# Patient Record
Sex: Female | Born: 1970 | Hispanic: Yes | Marital: Married | Smoking: Never smoker
Health system: Southern US, Community
[De-identification: ages and names within clinical notes are randomized; demographics above are authoritative.]

## PROBLEM LIST (undated history)

## (undated) DIAGNOSIS — Z789 Other specified health status: Secondary | ICD-10-CM

## (undated) HISTORY — PX: NO PAST SURGERIES: SHX2092

---

## 2004-05-01 ENCOUNTER — Ambulatory Visit (HOSPITAL_COMMUNITY): Admission: RE | Admit: 2004-05-01 | Discharge: 2004-05-01 | Payer: Self-pay | Admitting: *Deleted

## 2004-12-11 ENCOUNTER — Ambulatory Visit (HOSPITAL_COMMUNITY): Admission: RE | Admit: 2004-12-11 | Discharge: 2004-12-11 | Payer: Self-pay | Admitting: Obstetrics and Gynecology

## 2008-02-03 ENCOUNTER — Ambulatory Visit (HOSPITAL_COMMUNITY): Admission: RE | Admit: 2008-02-03 | Discharge: 2008-02-03 | Payer: Self-pay | Admitting: General Surgery

## 2008-04-11 ENCOUNTER — Ambulatory Visit (HOSPITAL_COMMUNITY): Admission: RE | Admit: 2008-04-11 | Discharge: 2008-04-11 | Payer: Self-pay | Admitting: General Surgery

## 2010-06-30 ENCOUNTER — Encounter: Payer: Self-pay | Admitting: General Surgery

## 2011-05-29 ENCOUNTER — Other Ambulatory Visit (HOSPITAL_COMMUNITY): Payer: Self-pay | Admitting: *Deleted

## 2011-06-25 ENCOUNTER — Other Ambulatory Visit (HOSPITAL_COMMUNITY): Payer: Self-pay | Admitting: *Deleted

## 2011-06-25 ENCOUNTER — Ambulatory Visit (HOSPITAL_COMMUNITY)
Admission: RE | Admit: 2011-06-25 | Discharge: 2011-06-25 | Disposition: A | Discharge status: Home / Self Care | Payer: PRIVATE HEALTH INSURANCE | Source: Ambulatory Visit | Attending: *Deleted | Admitting: *Deleted

## 2011-06-25 DIAGNOSIS — N6009 Solitary cyst of unspecified breast: Secondary | ICD-10-CM | POA: Insufficient documentation

## 2011-06-25 DIAGNOSIS — N63 Unspecified lump in unspecified breast: Secondary | ICD-10-CM | POA: Insufficient documentation

## 2012-07-26 ENCOUNTER — Other Ambulatory Visit (HOSPITAL_COMMUNITY): Payer: Self-pay | Admitting: Nurse Practitioner

## 2012-07-26 DIAGNOSIS — Z139 Encounter for screening, unspecified: Secondary | ICD-10-CM

## 2012-08-03 ENCOUNTER — Ambulatory Visit (HOSPITAL_COMMUNITY)
Admission: RE | Admit: 2012-08-03 | Discharge: 2012-08-03 | Disposition: A | Discharge status: Home / Self Care | Payer: Self-pay | Source: Ambulatory Visit | Attending: Nurse Practitioner | Admitting: Nurse Practitioner

## 2012-08-03 DIAGNOSIS — Z139 Encounter for screening, unspecified: Secondary | ICD-10-CM

## 2013-08-31 ENCOUNTER — Other Ambulatory Visit (HOSPITAL_COMMUNITY): Payer: Self-pay | Admitting: Nurse Practitioner

## 2013-08-31 DIAGNOSIS — Z1231 Encounter for screening mammogram for malignant neoplasm of breast: Secondary | ICD-10-CM

## 2013-09-05 ENCOUNTER — Ambulatory Visit (HOSPITAL_COMMUNITY)
Admission: RE | Admit: 2013-09-05 | Discharge: 2013-09-05 | Disposition: A | Discharge status: Home / Self Care | Payer: Self-pay | Source: Ambulatory Visit | Attending: Nurse Practitioner | Admitting: Nurse Practitioner

## 2013-09-05 DIAGNOSIS — Z1231 Encounter for screening mammogram for malignant neoplasm of breast: Secondary | ICD-10-CM

## 2013-09-07 ENCOUNTER — Other Ambulatory Visit: Payer: Self-pay | Admitting: Nurse Practitioner

## 2013-09-07 DIAGNOSIS — R928 Other abnormal and inconclusive findings on diagnostic imaging of breast: Secondary | ICD-10-CM

## 2013-09-28 ENCOUNTER — Ambulatory Visit (HOSPITAL_COMMUNITY)
Admission: RE | Admit: 2013-09-28 | Discharge: 2013-09-28 | Disposition: A | Discharge status: Home / Self Care | Payer: PRIVATE HEALTH INSURANCE | Source: Ambulatory Visit | Attending: Nurse Practitioner | Admitting: Nurse Practitioner

## 2013-09-28 DIAGNOSIS — R928 Other abnormal and inconclusive findings on diagnostic imaging of breast: Secondary | ICD-10-CM | POA: Insufficient documentation

## 2014-11-09 ENCOUNTER — Other Ambulatory Visit (HOSPITAL_COMMUNITY): Payer: Self-pay | Admitting: *Deleted

## 2014-11-09 DIAGNOSIS — Z1231 Encounter for screening mammogram for malignant neoplasm of breast: Secondary | ICD-10-CM

## 2014-11-27 ENCOUNTER — Ambulatory Visit (HOSPITAL_COMMUNITY)
Admission: RE | Admit: 2014-11-27 | Discharge: 2014-11-27 | Disposition: A | Discharge status: Home / Self Care | Payer: PRIVATE HEALTH INSURANCE | Source: Ambulatory Visit | Attending: *Deleted | Admitting: *Deleted

## 2014-11-27 DIAGNOSIS — Z1231 Encounter for screening mammogram for malignant neoplasm of breast: Secondary | ICD-10-CM | POA: Insufficient documentation

## 2016-01-22 ENCOUNTER — Other Ambulatory Visit (HOSPITAL_COMMUNITY): Payer: Self-pay | Admitting: *Deleted

## 2016-01-22 DIAGNOSIS — Z1231 Encounter for screening mammogram for malignant neoplasm of breast: Secondary | ICD-10-CM

## 2016-01-28 ENCOUNTER — Ambulatory Visit (HOSPITAL_COMMUNITY)
Admission: RE | Admit: 2016-01-28 | Discharge: 2016-01-28 | Disposition: A | Discharge status: Home / Self Care | Payer: Self-pay | Source: Ambulatory Visit | Attending: *Deleted | Admitting: *Deleted

## 2016-01-28 DIAGNOSIS — Z1231 Encounter for screening mammogram for malignant neoplasm of breast: Secondary | ICD-10-CM

## 2017-01-08 ENCOUNTER — Other Ambulatory Visit (HOSPITAL_COMMUNITY): Payer: Self-pay | Admitting: *Deleted

## 2017-01-08 DIAGNOSIS — Z1231 Encounter for screening mammogram for malignant neoplasm of breast: Secondary | ICD-10-CM

## 2017-02-02 ENCOUNTER — Encounter (HOSPITAL_COMMUNITY): Payer: Self-pay | Admitting: Radiology

## 2017-02-02 ENCOUNTER — Ambulatory Visit (HOSPITAL_COMMUNITY)
Admission: RE | Admit: 2017-02-02 | Discharge: 2017-02-02 | Disposition: A | Discharge status: Home / Self Care | Payer: PRIVATE HEALTH INSURANCE | Source: Ambulatory Visit | Attending: *Deleted | Admitting: *Deleted

## 2017-02-02 DIAGNOSIS — Z1231 Encounter for screening mammogram for malignant neoplasm of breast: Secondary | ICD-10-CM | POA: Diagnosis not present

## 2017-05-15 ENCOUNTER — Other Ambulatory Visit (HOSPITAL_COMMUNITY): Payer: Self-pay | Admitting: *Deleted

## 2017-05-15 DIAGNOSIS — N949 Unspecified condition associated with female genital organs and menstrual cycle: Secondary | ICD-10-CM

## 2017-05-15 DIAGNOSIS — N941 Unspecified dyspareunia: Secondary | ICD-10-CM

## 2017-05-20 ENCOUNTER — Ambulatory Visit (HOSPITAL_COMMUNITY)
Admission: RE | Admit: 2017-05-20 | Discharge: 2017-05-20 | Disposition: A | Discharge status: Home / Self Care | Payer: PRIVATE HEALTH INSURANCE | Source: Ambulatory Visit | Attending: *Deleted | Admitting: *Deleted

## 2017-05-25 ENCOUNTER — Ambulatory Visit (HOSPITAL_COMMUNITY)
Admission: RE | Admit: 2017-05-25 | Discharge: 2017-05-25 | Disposition: A | Discharge status: Home / Self Care | Payer: PRIVATE HEALTH INSURANCE | Source: Ambulatory Visit | Attending: *Deleted | Admitting: *Deleted

## 2017-05-25 DIAGNOSIS — N941 Unspecified dyspareunia: Secondary | ICD-10-CM

## 2017-05-25 DIAGNOSIS — N949 Unspecified condition associated with female genital organs and menstrual cycle: Secondary | ICD-10-CM

## 2018-01-12 ENCOUNTER — Other Ambulatory Visit (HOSPITAL_COMMUNITY): Payer: Self-pay | Admitting: *Deleted

## 2018-01-12 DIAGNOSIS — Z1231 Encounter for screening mammogram for malignant neoplasm of breast: Secondary | ICD-10-CM

## 2018-02-15 ENCOUNTER — Ambulatory Visit (HOSPITAL_COMMUNITY)
Admission: RE | Admit: 2018-02-15 | Discharge: 2018-02-15 | Disposition: A | Discharge status: Home / Self Care | Payer: PRIVATE HEALTH INSURANCE | Source: Ambulatory Visit | Attending: *Deleted | Admitting: *Deleted

## 2018-02-15 ENCOUNTER — Encounter (HOSPITAL_COMMUNITY): Payer: Self-pay

## 2018-02-15 DIAGNOSIS — Z1231 Encounter for screening mammogram for malignant neoplasm of breast: Secondary | ICD-10-CM | POA: Insufficient documentation

## 2018-02-16 ENCOUNTER — Other Ambulatory Visit (HOSPITAL_COMMUNITY): Payer: Self-pay | Admitting: *Deleted

## 2018-02-16 ENCOUNTER — Other Ambulatory Visit (HOSPITAL_COMMUNITY): Payer: Self-pay | Admitting: Nurse Practitioner

## 2018-02-16 ENCOUNTER — Ambulatory Visit (HOSPITAL_COMMUNITY)
Admission: RE | Admit: 2018-02-16 | Discharge: 2018-02-16 | Disposition: A | Discharge status: Home / Self Care | Payer: PRIVATE HEALTH INSURANCE | Source: Ambulatory Visit | Attending: Nurse Practitioner | Admitting: Nurse Practitioner

## 2018-02-16 DIAGNOSIS — M79672 Pain in left foot: Secondary | ICD-10-CM

## 2018-02-16 DIAGNOSIS — R928 Other abnormal and inconclusive findings on diagnostic imaging of breast: Secondary | ICD-10-CM

## 2018-02-16 DIAGNOSIS — M19072 Primary osteoarthritis, left ankle and foot: Secondary | ICD-10-CM | POA: Insufficient documentation

## 2018-03-02 ENCOUNTER — Encounter (HOSPITAL_COMMUNITY): Payer: PRIVATE HEALTH INSURANCE

## 2018-03-02 ENCOUNTER — Ambulatory Visit (HOSPITAL_COMMUNITY)
Admission: RE | Admit: 2018-03-02 | Discharge: 2018-03-02 | Disposition: A | Discharge status: Home / Self Care | Payer: PRIVATE HEALTH INSURANCE | Source: Ambulatory Visit | Attending: *Deleted | Admitting: *Deleted

## 2018-03-02 ENCOUNTER — Encounter (HOSPITAL_COMMUNITY): Payer: Self-pay

## 2018-03-02 DIAGNOSIS — R928 Other abnormal and inconclusive findings on diagnostic imaging of breast: Secondary | ICD-10-CM | POA: Insufficient documentation

## 2019-02-01 ENCOUNTER — Other Ambulatory Visit (HOSPITAL_COMMUNITY): Payer: Self-pay | Admitting: *Deleted

## 2019-02-01 DIAGNOSIS — Z1231 Encounter for screening mammogram for malignant neoplasm of breast: Secondary | ICD-10-CM

## 2019-03-03 ENCOUNTER — Other Ambulatory Visit: Payer: Self-pay

## 2019-03-03 ENCOUNTER — Ambulatory Visit (HOSPITAL_COMMUNITY)
Admission: RE | Admit: 2019-03-03 | Discharge: 2019-03-03 | Disposition: A | Discharge status: Home / Self Care | Payer: PRIVATE HEALTH INSURANCE | Source: Ambulatory Visit | Attending: *Deleted | Admitting: *Deleted

## 2019-03-03 DIAGNOSIS — Z1231 Encounter for screening mammogram for malignant neoplasm of breast: Secondary | ICD-10-CM | POA: Diagnosis not present

## 2019-03-07 ENCOUNTER — Other Ambulatory Visit (HOSPITAL_COMMUNITY): Payer: Self-pay | Admitting: *Deleted

## 2019-03-07 DIAGNOSIS — R921 Mammographic calcification found on diagnostic imaging of breast: Secondary | ICD-10-CM

## 2019-03-29 ENCOUNTER — Ambulatory Visit (HOSPITAL_COMMUNITY): Admission: RE | Admit: 2019-03-29 | Payer: PRIVATE HEALTH INSURANCE | Source: Ambulatory Visit

## 2019-03-29 ENCOUNTER — Ambulatory Visit (HOSPITAL_COMMUNITY)
Admission: RE | Admit: 2019-03-29 | Discharge: 2019-03-29 | Disposition: A | Discharge status: Home / Self Care | Payer: PRIVATE HEALTH INSURANCE | Source: Ambulatory Visit | Attending: *Deleted | Admitting: *Deleted

## 2019-03-29 ENCOUNTER — Other Ambulatory Visit: Payer: Self-pay

## 2019-03-29 DIAGNOSIS — R921 Mammographic calcification found on diagnostic imaging of breast: Secondary | ICD-10-CM

## 2019-04-05 ENCOUNTER — Other Ambulatory Visit (HOSPITAL_COMMUNITY): Payer: Self-pay | Admitting: *Deleted

## 2019-09-21 ENCOUNTER — Other Ambulatory Visit (HOSPITAL_COMMUNITY): Payer: Self-pay | Admitting: *Deleted

## 2019-09-21 DIAGNOSIS — R921 Mammographic calcification found on diagnostic imaging of breast: Secondary | ICD-10-CM

## 2019-09-28 ENCOUNTER — Other Ambulatory Visit (HOSPITAL_COMMUNITY): Payer: Self-pay | Admitting: *Deleted

## 2019-09-28 DIAGNOSIS — R921 Mammographic calcification found on diagnostic imaging of breast: Secondary | ICD-10-CM

## 2019-10-11 ENCOUNTER — Ambulatory Visit (HOSPITAL_COMMUNITY)
Admission: RE | Admit: 2019-10-11 | Discharge: 2019-10-11 | Disposition: A | Discharge status: Home / Self Care | Payer: PRIVATE HEALTH INSURANCE | Source: Ambulatory Visit | Attending: *Deleted | Admitting: *Deleted

## 2019-10-11 ENCOUNTER — Other Ambulatory Visit: Payer: Self-pay

## 2019-10-11 DIAGNOSIS — R921 Mammographic calcification found on diagnostic imaging of breast: Secondary | ICD-10-CM

## 2020-01-16 ENCOUNTER — Other Ambulatory Visit (HOSPITAL_COMMUNITY): Payer: Self-pay | Admitting: Physician Assistant

## 2020-01-16 ENCOUNTER — Other Ambulatory Visit: Payer: Self-pay | Admitting: Physician Assistant

## 2020-01-16 DIAGNOSIS — G44221 Chronic tension-type headache, intractable: Secondary | ICD-10-CM

## 2020-01-16 DIAGNOSIS — R42 Dizziness and giddiness: Secondary | ICD-10-CM

## 2020-02-07 ENCOUNTER — Ambulatory Visit (HOSPITAL_COMMUNITY)
Admission: RE | Admit: 2020-02-07 | Discharge: 2020-02-07 | Disposition: A | Discharge status: Home / Self Care | Payer: Self-pay | Source: Ambulatory Visit | Attending: Physician Assistant | Admitting: Physician Assistant

## 2020-02-07 ENCOUNTER — Other Ambulatory Visit: Payer: Self-pay

## 2020-02-07 DIAGNOSIS — G44221 Chronic tension-type headache, intractable: Secondary | ICD-10-CM | POA: Insufficient documentation

## 2020-02-07 DIAGNOSIS — R42 Dizziness and giddiness: Secondary | ICD-10-CM | POA: Insufficient documentation

## 2020-03-15 ENCOUNTER — Other Ambulatory Visit (HOSPITAL_COMMUNITY): Payer: Self-pay | Admitting: *Deleted

## 2020-03-15 DIAGNOSIS — N63 Unspecified lump in unspecified breast: Secondary | ICD-10-CM

## 2020-03-28 ENCOUNTER — Other Ambulatory Visit (HOSPITAL_COMMUNITY): Payer: Self-pay | Admitting: *Deleted

## 2020-03-28 DIAGNOSIS — N63 Unspecified lump in unspecified breast: Secondary | ICD-10-CM

## 2020-04-17 ENCOUNTER — Other Ambulatory Visit: Payer: Self-pay

## 2020-04-17 ENCOUNTER — Ambulatory Visit (HOSPITAL_COMMUNITY)
Admission: RE | Admit: 2020-04-17 | Discharge: 2020-04-17 | Disposition: A | Discharge status: Home / Self Care | Payer: PRIVATE HEALTH INSURANCE | Source: Ambulatory Visit | Attending: *Deleted | Admitting: *Deleted

## 2020-04-17 DIAGNOSIS — N63 Unspecified lump in unspecified breast: Secondary | ICD-10-CM

## 2021-01-17 ENCOUNTER — Other Ambulatory Visit: Payer: Self-pay

## 2021-01-17 DIAGNOSIS — R921 Mammographic calcification found on diagnostic imaging of breast: Secondary | ICD-10-CM

## 2021-01-31 ENCOUNTER — Other Ambulatory Visit: Payer: Self-pay | Admitting: Obstetrics and Gynecology

## 2021-01-31 DIAGNOSIS — R921 Mammographic calcification found on diagnostic imaging of breast: Secondary | ICD-10-CM

## 2021-04-26 ENCOUNTER — Encounter (HOSPITAL_COMMUNITY): Payer: Self-pay

## 2021-04-26 ENCOUNTER — Other Ambulatory Visit: Payer: Self-pay

## 2021-04-26 ENCOUNTER — Inpatient Hospital Stay (HOSPITAL_COMMUNITY): Payer: Self-pay | Attending: Obstetrics and Gynecology | Admitting: *Deleted

## 2021-04-26 VITALS — BP 108/68 | Wt 139.0 lb

## 2021-04-26 DIAGNOSIS — Z1239 Encounter for other screening for malignant neoplasm of breast: Secondary | ICD-10-CM

## 2021-04-26 DIAGNOSIS — Z1211 Encounter for screening for malignant neoplasm of colon: Secondary | ICD-10-CM

## 2021-04-26 NOTE — Patient Instructions (Addendum)
Explained breast self awareness witht [name]. Pap smear is due today. Patient refused Pap smear today. Explained the importance of Pap smears. Patient stated she is scheduled for a Pap smear at the [facility] on 05/06/2021. Let her know [facility] will cover Pap smears every 3 years unless has a history of abnormal Pap smears. Referred patient to [facility] for a diagnostic mammogram per recommendation. Appointment scheduled Tuesday, April 30, 2021 at 1040. Patient aware of appointment and will be there. [name] verbalized understanding.  Khiyan Crace, Kathaleen Maser, RN 9:21 AM

## 2021-04-26 NOTE — Progress Notes (Signed)
[name] is a 50 y.o. female who presents to [facility] clinic today with no complaints. Patient referred to [facility] due to patients last mammogram was completed 04/17/2020 that a bilateral diagnostic mammogram was recommended in one year related to right breast calcifications.    Pap Smear: Pap smear not completed today. Last Pap smear was in 2019 at the [facility] clinic and was normal per patient. Per patient has no history of an abnormal Pap smear. Last Pap smear result is not available in Epic.   Physical exam: Breasts Breasts symmetrical. No skin abnormalities bilateral breasts. No nipple retraction bilateral breasts. No nipple discharge bilateral breasts. No lymphadenopathy. No lumps palpated bilateral breasts.      MM DIAG BREAST TOMO UNI RIGHT  Result Date: 10/11/2019 CLINICAL DATA:  Short-term follow-up for probably benign right breast calcifications. EXAM: DIGITAL DIAGNOSTIC UNILATERAL RIGHT MAMMOGRAM WITH CAD AND TOMO COMPARISON:  Previous exams. ACR Breast Density Category c: The breast tissue is heterogeneously dense, which may obscure small masses. FINDINGS: No suspicious masses or calcifications are seen in the right breast. Spot compression magnification views were performed over the lower inner right breast. The punctate calcifications spanning less than 2 mm appear unchanged. There is no mammographic evidence of malignancy. Mammographic images were processed with CAD. IMPRESSION: Stable probably benign right breast calcifications. No mammographic evidence of malignancy. RECOMMENDATION: Bilateral diagnostic mammography with magnification views of the right breast October 2021 which will demonstrate 1 year of stability of the probably benign right breast calcifications. I have discussed the findings and recommendations with the patient. If applicable, a reminder letter will be sent to the patient regarding the next appointment. BI-RADS CATEGORY  3: Probably benign.  Electronically Signed   By: Edwin Cap M.D.   On: 10/11/2019 09:16   MM DIAG BREAST TOMO UNI RIGHT  Result Date: 03/29/2019 CLINICAL DATA:  Screening recall for right breast calcifications. EXAM: DIGITAL DIAGNOSTIC UNILATERAL RIGHT MAMMOGRAM WITH CAD AND TOMO COMPARISON:  Previous exam(s). ACR Breast Density Category c: The breast tissue is heterogeneously dense, which may obscure small masses. FINDINGS: Spot compression magnification views were performed of the slightly inner/periareolar right breast. There are 3 punctate calcifications spanning less than 2 mm. Mammographic images were processed with CAD. IMPRESSION: Probably benign right breast calcifications. RECOMMENDATION: Diagnostic mammography of the right breast with magnification views in 6 months. I have discussed the findings and recommendations with the patient. If applicable, a reminder letter will be sent to the patient regarding the next appointment. BI-RADS CATEGORY  3: Probably benign. Electronically Signed   By: Edwin Cap M.D.   On: 03/29/2019 10:08   MM 3D SCREEN BREAST BILATERAL  Result Date: 03/03/2019 CLINICAL DATA:  Screening. EXAM: DIGITAL SCREENING BILATERAL MAMMOGRAM WITH TOMO AND CAD COMPARISON:  Previous exam(s). ACR Breast Density Category c: The breast tissue is heterogeneously dense, which may obscure small masses. FINDINGS: In the right breast, calcifications warrant further evaluation with magnified views. In the left breast, no findings suspicious for malignancy. Images were processed with CAD. IMPRESSION: Further evaluation is suggested for calcifications in the right breast. RECOMMENDATION: Diagnostic mammogram of the right breast. (Code:[id number]) The patient will be contacted regarding the findings, and additional imaging will be scheduled. BI-RADS CATEGORY  0: Incomplete. Need additional imaging evaluation and/or prior mammograms for comparison. Electronically Signed   By: Emmaline Kluver M.D.   On:  03/03/2019 14:29   MS DIGITAL SCREENING TOMO BILATERAL  Result Date: 02/15/2018 CLINICAL DATA:  Screening. EXAM:  DIGITAL SCREENING BILATERAL MAMMOGRAM WITH TOMO AND CAD COMPARISON:  Previous exam(s). ACR Breast Density Category c: The breast tissue is heterogeneously dense, which may obscure small masses. FINDINGS: In the left breast, a possible mass warrants further evaluation. In the right breast, no findings suspicious for malignancy. Images were processed with CAD. IMPRESSION: Further evaluation is suggested for possible mass in the left breast. RECOMMENDATION: Diagnostic mammogram and possibly ultrasound of the left breast. (Code:[id number]) The patient will be contacted regarding the findings, and additional imaging will be scheduled. BI-RADS CATEGORY  0: Incomplete. Need additional imaging evaluation and/or prior mammograms for comparison. Electronically Signed   By: Sande Brothers M.D.   On: 02/15/2018 15:42   MS DIGITAL SCREENING TOMO BILATERAL  Result Date: 02/02/2017 CLINICAL DATA:  Screening. EXAM: 2D DIGITAL SCREENING BILATERAL MAMMOGRAM WITH CAD AND ADJUNCT TOMO COMPARISON:  Previous exam(s). ACR Breast Density Category c: The breast tissue is heterogeneously dense, which may obscure small masses. FINDINGS: There are no findings suspicious for malignancy. Images were processed with CAD. IMPRESSION: No mammographic evidence of malignancy. A result letter of this screening mammogram will be mailed directly to the patient. RECOMMENDATION: Screening mammogram in one year. (Code:[id number]) BI-RADS CATEGORY  1: Negative. Electronically Signed   By: Manfred Arch M.D.   On: 02/02/2017 11:03   MS DIGITAL DIAG TOMO BILAT  Result Date: 04/17/2020 CLINICAL DATA:  Short-term follow-up for probably benign right breast calcifications. EXAM: DIGITAL DIAGNOSTIC BILATERAL MAMMOGRAM WITH TOMO AND CAD COMPARISON:  Previous exams. ACR Breast Density Category d: The breast tissue is extremely dense, which  lowers the sensitivity of mammography. FINDINGS: No suspicious masses or calcifications are seen in either breast. The less than 2 mm group of punctate calcifications in the lower slightly inner right breast appear unchanged. There is no mammographic evidence of malignancy. Mammographic images were processed with CAD. IMPRESSION: Stable probably benign right breast calcifications. No mammographic evidence of malignancy in either breast. RECOMMENDATION: Bilateral diagnostic mammography in 1 year which will demonstrate 2 years of stability of the probably benign right breast calcifications. I have discussed the findings and recommendations with the patient. If applicable, a reminder letter will be sent to the patient regarding the next appointment. BI-RADS CATEGORY  3: Probably benign. Electronically Signed   By: Edwin Cap M.D.   On: 04/17/2020 10:52   MS DIGITAL DIAG TOMO UNI LEFT  Result Date: 03/02/2018 CLINICAL DATA:  50 year old patient recalled from recent screening mammogram for possible mass in the left breast. A Spanish interpreter was present today. Patient has a known history of breast cysts. EXAM: DIGITAL DIAGNOSTIC LEFT MAMMOGRAM WITH TOMO ULTRASOUND LEFT BREAST COMPARISON:  02/25/2018 and earlier priors ACR Breast Density Category c: The breast tissue is heterogeneously dense, which may obscure small masses. FINDINGS: Spot compression views of the central left breast show heterogeneously dense breast parenchyma, with some dispersion of the area in question on recent screening mammogram. No suspicious mass or architectural distortion is identified. On physical exam, no mass is palpated in the upper inner left breast. Targeted ultrasound is performed, showing a 0.8 cm oval simple cyst at 12 o'clock position 2 cm from the nipple. No solid or suspicious mass is identified in the medial left breast. IMPRESSION: No evidence of malignancy in the left breast. 0.8 cm simple cyst at 12 o'clock position  left breast seen on ultrasound, likely incidental. RECOMMENDATION: Screening mammogram in one year.(Code:[id number]) I have discussed the findings and recommendations with the patient. Results were also provided in writing  at the conclusion of the visit. If applicable, a reminder letter will be sent to the patient regarding the next appointment. BI-RADS CATEGORY  2: Benign. Electronically Signed   By: Britta Mccreedy M.D.   On: 03/02/2018 09:58    Pelvic/Bimanual Pap smear is due today. Patient refused Pap smear today. Explained the importance of Pap smears. Patient stated she is scheduled for a Pap smear at the [facility] on 05/06/2021.   Smoking History: Patient has never smoked.   Patient Navigation: Patient education provided. Access to services provided for patient through [employer] program. Spanish interpreter Lynnell Chad from [employer] provided.  Colorectal Cancer Screening: Per patient has never had colonoscopy completed. FIT Test given to patient to complete. No complaints today.    Breast and Cervical Cancer Risk Assessment: Patient does not have family history of breast cancer, known genetic mutations, or radiation treatment to the chest before age 59. Patient does not have history of cervical dysplasia, immunocompromised, or DES exposure in-utero.  Risk Assessment     Risk Scores       04/26/2021   Last edited by: Priscille Heidelberg, RN   5-year risk: 0.6 %   Lifetime risk: 5.2 %           A: [facility] exam without pap smear No complaints.  P: Referred patient to [facility] for a diagnostic mammogram per recommendation. Appointment scheduled Tuesday, April 30, 2021 at 1040.  Priscille Heidelberg, RN 04/26/2021 10:28 AM    Priscille Heidelberg, RN 04/26/2021 9:21 AM

## 2021-04-30 ENCOUNTER — Other Ambulatory Visit: Payer: Self-pay

## 2021-04-30 ENCOUNTER — Ambulatory Visit (HOSPITAL_COMMUNITY)
Admission: RE | Admit: 2021-04-30 | Discharge: 2021-04-30 | Disposition: A | Discharge status: Home / Self Care | Payer: Self-pay | Source: Ambulatory Visit | Attending: Obstetrics and Gynecology | Admitting: Obstetrics and Gynecology

## 2021-04-30 DIAGNOSIS — R921 Mammographic calcification found on diagnostic imaging of breast: Secondary | ICD-10-CM

## 2021-05-02 LAB — FECAL OCCULT BLOOD, IMMUNOCHEMICAL: Fecal Occult Bld: NEGATIVE

## 2021-05-07 ENCOUNTER — Telehealth: Payer: Self-pay

## 2021-05-07 NOTE — Telephone Encounter (Signed)
Via Erika McReynolds, Spanish Interpreter ([employer]), Patient informed negative FIT test results. Patient verbalized understanding.  

## 2022-03-17 IMAGING — MG DIGITAL DIAGNOSTIC BILAT W/ TOMO W/ CAD
9 of 15 series · 9 of 39 positions shown · non-contrast
Comparison: Previous exams.

CLINICAL DATA: Short-term follow-up for probably benign right
breast calcifications.

EXAM:
DIGITAL DIAGNOSTIC BILATERAL MAMMOGRAM WITH TOMOSYNTHESIS AND CAD
TECHNIQUE: Bilateral digital diagnostic mammography and breast tomosynthesis
was performed. The images were evaluated with computer-aided
detection.

[R CC]
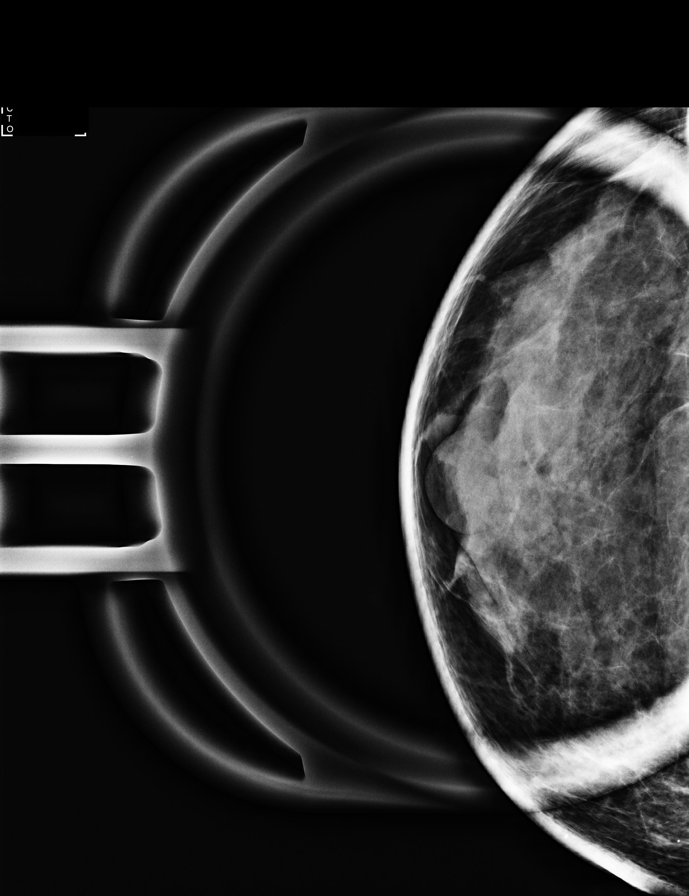

[R ML (1 of 2)]
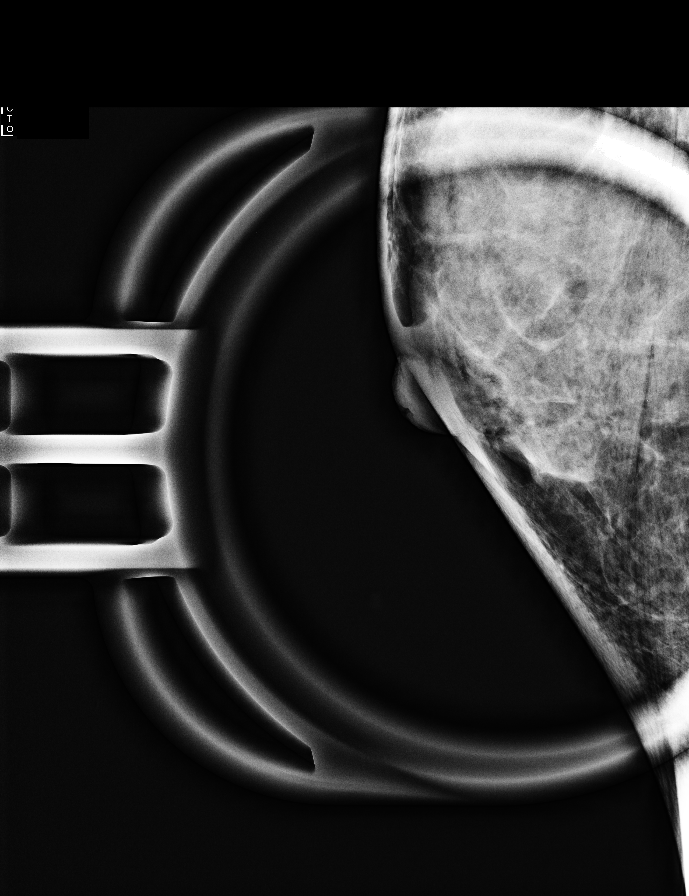

[R ML (2 of 2)]
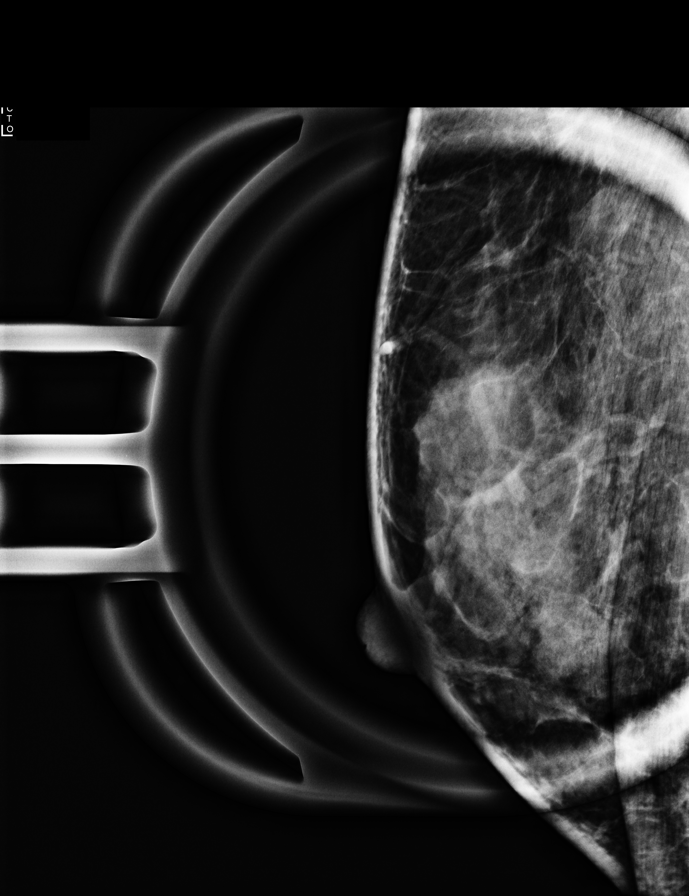

[R MLO synth-2D]
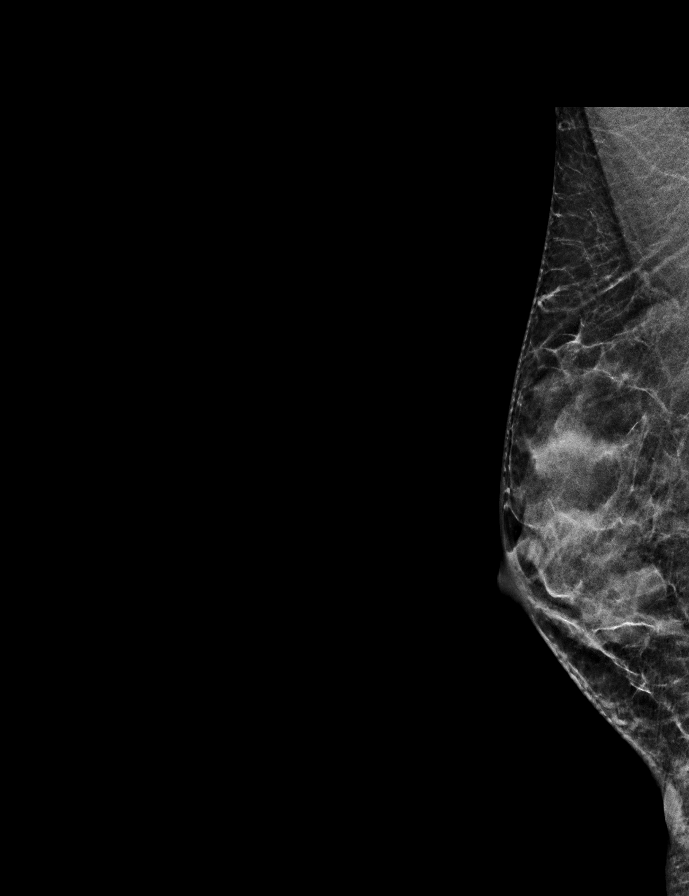

[L MLO synth-2D]
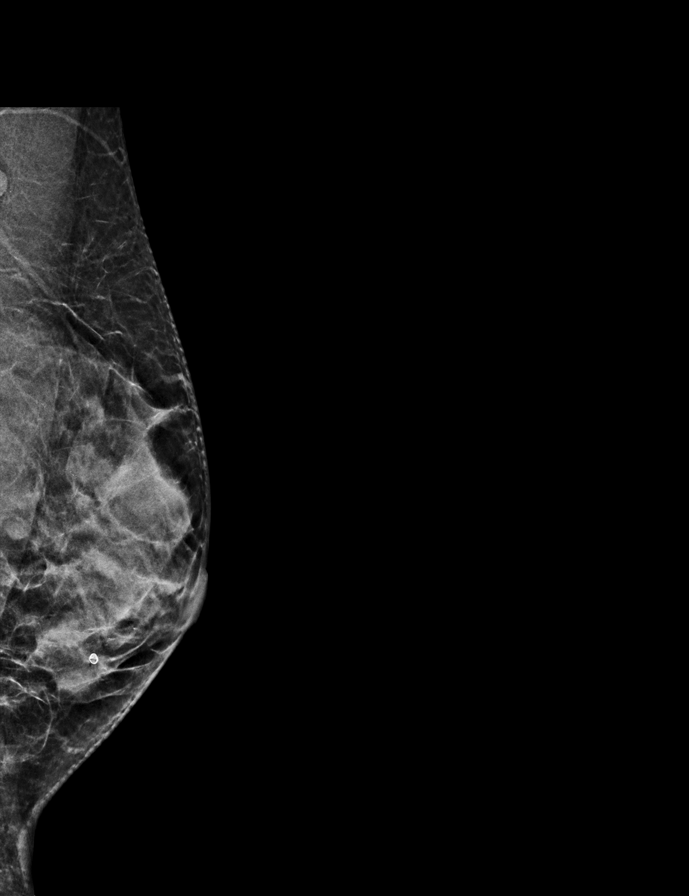

[R ML synth-2D]
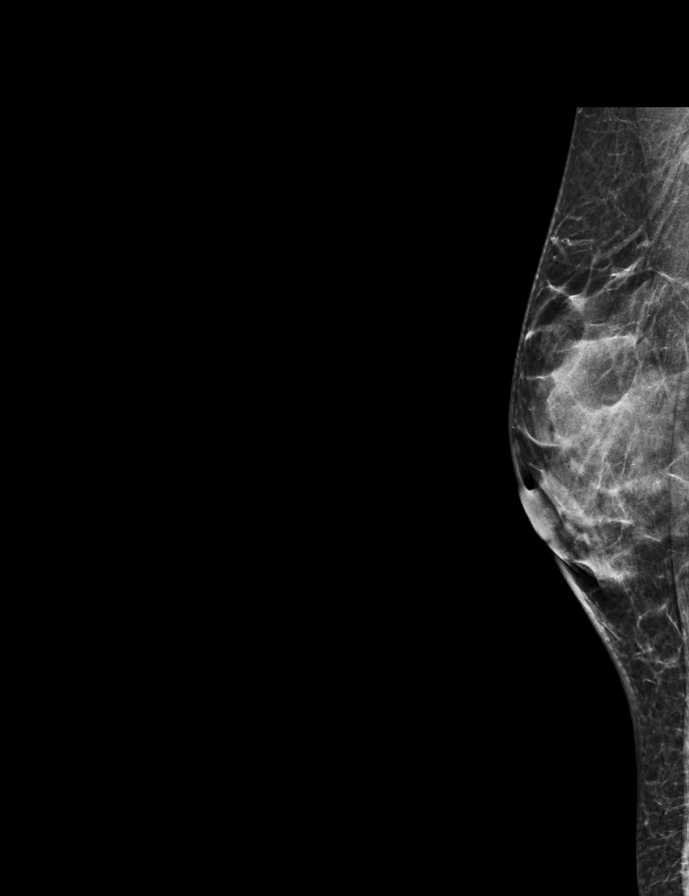

[L CC synth-2D (1 of 2)]
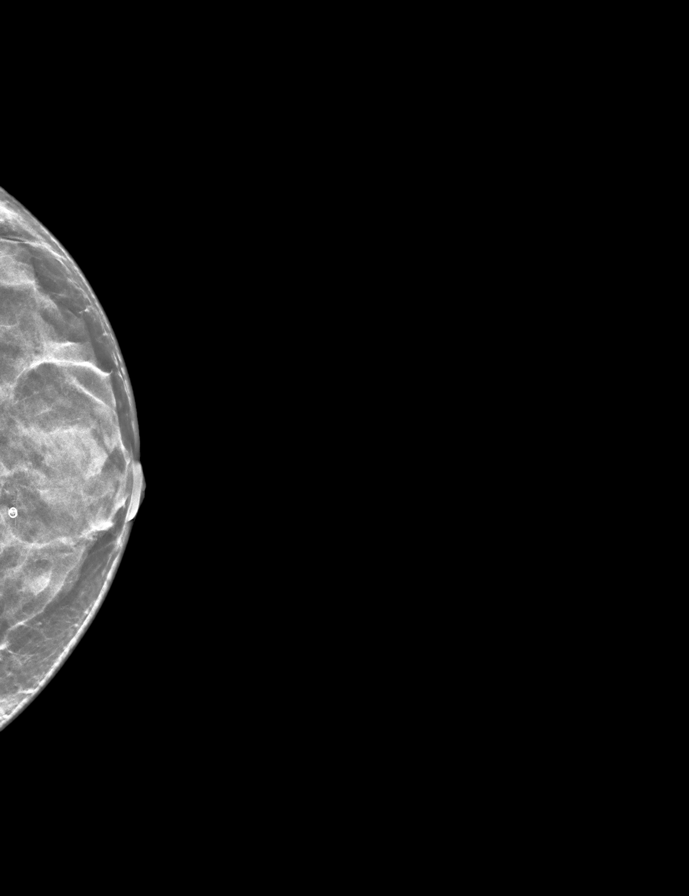

[L CC synth-2D (2 of 2)]
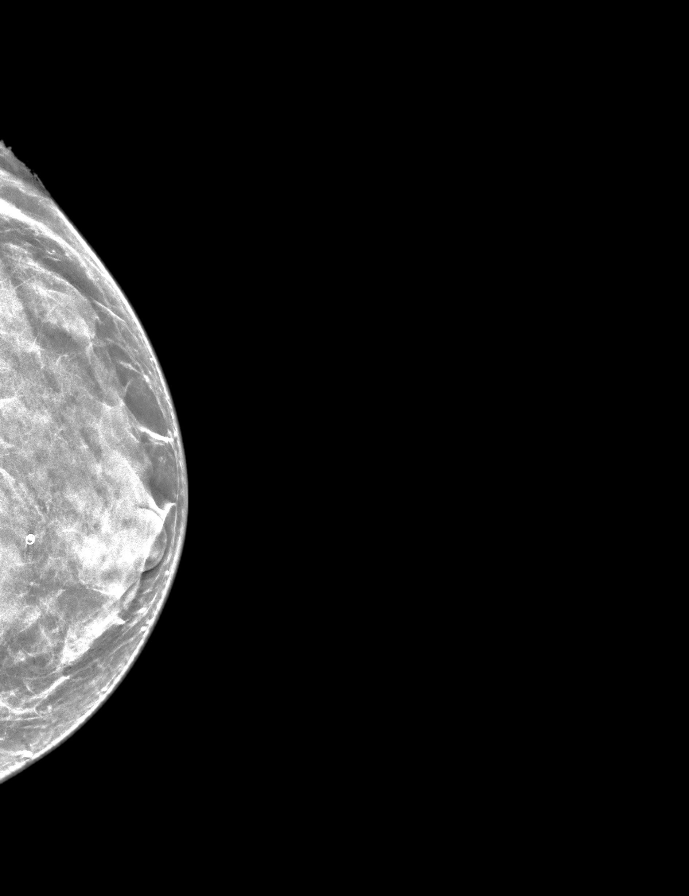

[R CC synth-2D]
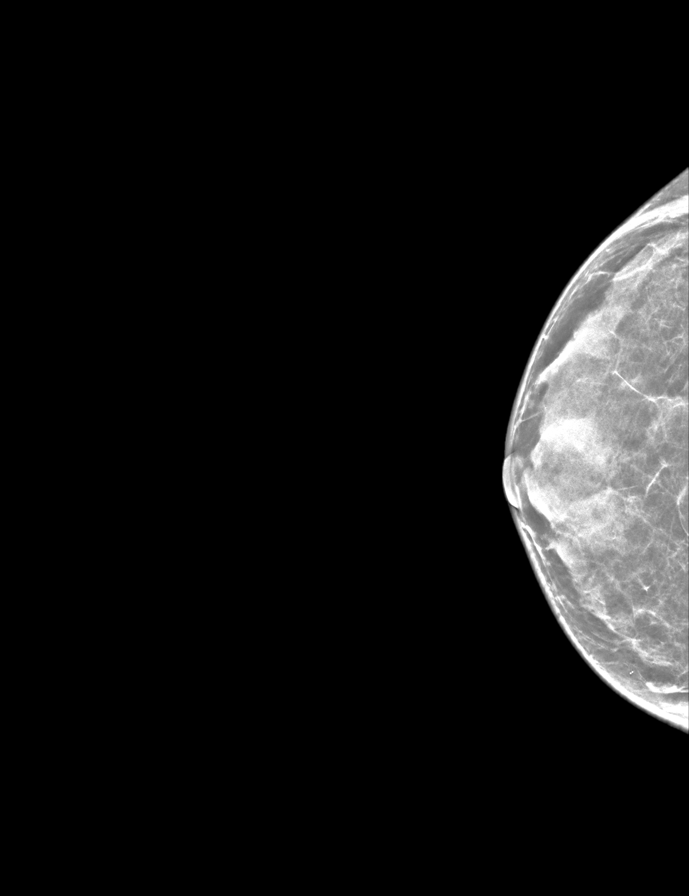

[9 of 39 positions shown; findings below may reference images not displayed]

ACR Breast Density Category d: The breast tissue is extremely dense,
which lowers the sensitivity of mammography.
FINDINGS: No suspicious masses or calcifications seen in either breast. Spot
compression magnification views were performed of the retroareolar
right breast demonstrating near complete resolution of the
previously seen calcifications. These are likely related to a cyst
which has since resolved. There are no findings of malignancy in
either breast.
IMPRESSION: No findings of malignancy in either breast.

RECOMMENDATION:
Screening mammogram in one year.(Code:[id number])

I have discussed the findings and recommendations with the patient.
If applicable, a reminder letter will be sent to the patient
regarding the next appointment.

BI-RADS CATEGORY  2: Benign.

## 2022-04-23 DIAGNOSIS — I83819 Varicose veins of unspecified lower extremities with pain: Secondary | ICD-10-CM | POA: Insufficient documentation

## 2022-04-23 DIAGNOSIS — I8310 Varicose veins of unspecified lower extremity with inflammation: Secondary | ICD-10-CM | POA: Insufficient documentation

## 2022-04-23 NOTE — Progress Notes (Unsigned)
MRN : [id number]  Margaret Greene is a 51 y.o. (02-Mar-1971) female who presents with chief complaint of varicose veins hurt.  History of Present Illness:   The patient is seen for evaluation of symptomatic varicose veins. The patient relates burning and stinging which worsened steadily throughout the course of the day, particularly with standing. The patient also notes an aching and throbbing pain over the varicosities, particularly with prolonged dependent positions. The symptoms are significantly improved with elevation.  The patient also notes that during hot weather the symptoms are greatly intensified. The patient states the pain from the varicose veins interferes with work, daily exercise, shopping and household maintenance. At this point, the symptoms are persistent and severe enough that they're having a negative impact on lifestyle and are interfering with daily activities.  There is no history of DVT, PE or superficial thrombophlebitis. There is no history of ulceration or hemorrhage. The patient denies a significant family history of varicose veins.  The patient has not worn graduated compression in the past. At the present time the patient has not been using over-the-counter analgesics. There is no history of prior surgical intervention or sclerotherapy.   No outpatient medications have been marked as taking for the 04/24/22 encounter (Appointment) with Delana Meyer, Dolores Lory, MD.    No past medical history on file.  No past surgical history on file.  Social History Social History   Tobacco Use   Smoking status: Never    Passive exposure: Never   Smokeless tobacco: Never  Vaping Use   Vaping Use: Never used  Substance Use Topics   Alcohol use: Never   Drug use: Never    Family History No family history on file.  Not on File   REVIEW OF SYSTEMS (Negative unless checked)  Constitutional: [] Weight loss  [] Fever  [] Chills Cardiac: [] Chest pain   [] Chest pressure    [] Palpitations   [] Shortness of breath when laying flat   [] Shortness of breath with exertion. Vascular:  [] Pain in legs with walking   [x] Pain in legs with standing  [] History of DVT   [] Phlebitis   [] Swelling in legs   [x] Varicose veins   [] Non-healing ulcers Pulmonary:   [] Uses home oxygen   [] Productive cough   [] Hemoptysis   [] Wheeze  [] COPD   [] Asthma Neurologic:  [] Dizziness   [] Seizures   [] History of stroke   [] History of TIA  [] Aphasia   [] Vissual changes   [] Weakness or numbness in arm   [] Weakness or numbness in leg Musculoskeletal:   [] Joint swelling   [] Joint pain   [] Low back pain Hematologic:  [] Easy bruising  [] Easy bleeding   [] Hypercoagulable state   [] Anemic Gastrointestinal:  [] Diarrhea   [] Vomiting  [] Gastroesophageal reflux/heartburn   [] Difficulty swallowing. Genitourinary:  [] Chronic kidney disease   [] Difficult urination  [] Frequent urination   [] Blood in urine Skin:  [] Rashes   [] Ulcers  Psychological:  [] History of anxiety   []  History of major depression.  Physical Examination  There were no vitals filed for this visit. There is no height or weight on file to calculate BMI. Gen: WD/WN, NAD Head: [place]/AT, No temporalis wasting.  Ear/Nose/Throat: Hearing grossly intact, nares w/o erythema or drainage, pinna without lesions Eyes: PER, EOMI, sclera nonicteric.  Neck: Supple, no gross masses.  No JVD.  Pulmonary:  Good air movement, no audible wheezing, no use of accessory muscles.  Cardiac: RRR, precordium not hyperdynamic. Vascular:  Large varicosities present, greater than 10 mm ***.  Veins are tender to palpation  Mild venous stasis changes to the legs bilaterally.  Trace soft pitting edema CEAP [id number] Vessel Right Left  Radial Palpable Palpable  Gastrointestinal: soft, non-distended. No guarding/no peritoneal signs.  Musculoskeletal: M/S 5/5 throughout.  No deformity.  Neurologic: CN 2-12 intact. Pain and light touch intact in extremities.  Symmetrical.   Speech is fluent. Motor exam as listed above. Psychiatric: Judgment intact, Mood & affect appropriate for pt's clinical situation. Dermatologic: Venous rashes no ulcers noted.  No changes consistent with cellulitis. Lymph : No lichenification or skin changes of chronic lymphedema.  CBC No results found for: "WBC", "HGB", "HCT", "MCV", "PLT"  BMET No results found for: "NA", "K", "CL", "CO2", "GLUCOSE", "BUN", "CREATININE", "CALCIUM", "GFRNONAA", "GFRAA" CrCl cannot be calculated (No successful lab value found.).  COAG No results found for: "INR", "PROTIME"  Radiology No results found.   Assessment/Plan There are no diagnoses linked to this encounter.   Levora Dredge, MD  04/23/2022 2:18 PM

## 2022-04-24 ENCOUNTER — Encounter (INDEPENDENT_AMBULATORY_CARE_PROVIDER_SITE_OTHER): Payer: Self-pay | Admitting: Vascular Surgery

## 2022-04-24 ENCOUNTER — Ambulatory Visit (INDEPENDENT_AMBULATORY_CARE_PROVIDER_SITE_OTHER): Payer: Self-pay

## 2022-04-24 ENCOUNTER — Other Ambulatory Visit: Payer: Self-pay | Admitting: Vascular Surgery

## 2022-04-24 ENCOUNTER — Encounter (INDEPENDENT_AMBULATORY_CARE_PROVIDER_SITE_OTHER): Payer: Self-pay

## 2022-04-24 ENCOUNTER — Ambulatory Visit (INDEPENDENT_AMBULATORY_CARE_PROVIDER_SITE_OTHER): Payer: Self-pay | Admitting: Vascular Surgery

## 2022-04-24 DIAGNOSIS — I8310 Varicose veins of unspecified lower extremity with inflammation: Secondary | ICD-10-CM

## 2022-04-24 DIAGNOSIS — I83893 Varicose veins of bilateral lower extremities with other complications: Secondary | ICD-10-CM

## 2022-05-15 ENCOUNTER — Encounter: Payer: Self-pay | Admitting: *Deleted

## 2022-06-15 NOTE — Progress Notes (Signed)
   Indication:  Patient presents with symptomatic varicose veins of the bilateral lower extremity.  Procedure:  Sclerotherapy using hypertonic saline mixed with 1% Lidocaine was performed on the bilateral lower extremity.  Compression wraps were placed.  The patient tolerated the procedure well.  Plan:  Follow up as needed.

## 2022-06-16 ENCOUNTER — Ambulatory Visit (INDEPENDENT_AMBULATORY_CARE_PROVIDER_SITE_OTHER): Payer: Self-pay | Admitting: Vascular Surgery

## 2022-06-16 VITALS — BP 113/70 | HR 65 | Resp 16 | Ht 64.0 in | Wt 133.0 lb

## 2022-06-16 DIAGNOSIS — I83819 Varicose veins of unspecified lower extremities with pain: Secondary | ICD-10-CM

## 2022-06-20 ENCOUNTER — Encounter (INDEPENDENT_AMBULATORY_CARE_PROVIDER_SITE_OTHER): Payer: Self-pay | Admitting: Vascular Surgery

## 2022-07-08 ENCOUNTER — Other Ambulatory Visit: Payer: Self-pay | Admitting: Nurse Practitioner

## 2022-07-08 DIAGNOSIS — Z1231 Encounter for screening mammogram for malignant neoplasm of breast: Secondary | ICD-10-CM

## 2022-07-18 NOTE — Progress Notes (Unsigned)
   Indication:  Patient presents with symptomatic varicose veins of the bilateral lower extremity.  Procedure:  Sclerotherapy using hypertonic saline mixed with 1% Lidocaine was performed on the bilateral lower extremities.  Compression wraps were placed.  The patient tolerated the procedure well.  Plan:  Follow up as needed.

## 2022-07-21 ENCOUNTER — Ambulatory Visit (INDEPENDENT_AMBULATORY_CARE_PROVIDER_SITE_OTHER): Payer: Self-pay | Admitting: Vascular Surgery

## 2022-07-21 ENCOUNTER — Encounter (INDEPENDENT_AMBULATORY_CARE_PROVIDER_SITE_OTHER): Payer: Self-pay | Admitting: Vascular Surgery

## 2022-07-21 VITALS — BP 112/68 | HR 60 | Resp 16

## 2022-07-21 DIAGNOSIS — I83819 Varicose veins of unspecified lower extremities with pain: Secondary | ICD-10-CM

## 2022-07-22 ENCOUNTER — Encounter (INDEPENDENT_AMBULATORY_CARE_PROVIDER_SITE_OTHER): Payer: Self-pay | Admitting: Vascular Surgery

## 2022-07-28 ENCOUNTER — Ambulatory Visit (HOSPITAL_COMMUNITY)
Admission: RE | Admit: 2022-07-28 | Discharge: 2022-07-28 | Disposition: A | Discharge status: Home / Self Care | Payer: Self-pay | Source: Ambulatory Visit | Attending: Nurse Practitioner | Admitting: Nurse Practitioner

## 2022-07-28 ENCOUNTER — Encounter (HOSPITAL_COMMUNITY): Payer: Self-pay

## 2022-07-28 DIAGNOSIS — Z1231 Encounter for screening mammogram for malignant neoplasm of breast: Secondary | ICD-10-CM | POA: Insufficient documentation

## 2022-08-21 ENCOUNTER — Telehealth (INDEPENDENT_AMBULATORY_CARE_PROVIDER_SITE_OTHER): Payer: Self-pay | Admitting: *Deleted

## 2022-08-21 NOTE — Telephone Encounter (Signed)
  Procedure: Colonoscopy  Height: 5'2 Weight: 132lbs      Have you had a colonoscopy before?  no  Do you have family history of colon cancer?  no  Do you have a family history of polyps? no  Previous colonoscopy with polyps removed? no  Do you have a history colorectal cancer?   no  Are you diabetic?  no  Do you have a prosthetic or mechanical heart valve? no  Do you have a pacemaker/defibrillator?   no  Have you had endocarditis/atrial fibrillation?  no  Do you use supplemental oxygen/CPAP?  no  Have you had joint replacement within the last 12 months?  no  Do you tend to be constipated or have to use laxatives?  no   Do you have history of alcohol use? If yes, how much and how often.  no  Do you have history or are you using drugs? If yes, what do are you  using?  no  Have you ever had a stroke/heart attack?  no  Have you ever had a heart or other vascular stent placed,?no  Do you take weight loss medication? no  female patients,: have you had a hysterectomy? no                              are you post menopausal?  yes                              do you still have your menstrual cycle? no    Date of last menstrual period? 5 years ago  Do you take any blood-thinning medications such as: (Plavix, aspirin, Coumadin, Aggrenox, Brilinta, Xarelto, Eliquis, Pradaxa, Savaysa or Effient)? no  If yes we need the name, milligram, dosage and who is prescribing doctor:               Current Outpatient Medications  Medication Sig Dispense Refill   BIOTIN W/ VITAMINS C & E PO Take by mouth.     cholecalciferol (VITAMIN D3) 25 MCG (1000 UNIT) tablet Take 1,000 Units by mouth daily.     magnesium (MAGTAB) 84 MG (7MEQ) TBCR SR tablet Take 84 mg by mouth.     Multiple Vitamins-Minerals (MULTIVITAMIN WITH MINERALS) tablet Take 1 tablet by mouth daily.     No current facility-administered medications for this visit.    No Known Allergies

## 2022-08-23 NOTE — Progress Notes (Signed)
   Indication:  Patient presents with symptomatic varicose veins of the bilateral lower extremity.  Procedure:  Sclerotherapy using hypertonic saline mixed with 1% Lidocaine was performed on the bilateral lower extremity.  Compression wraps were placed.  The patient tolerated the procedure well.  Plan:  Follow up as needed.  

## 2022-08-25 ENCOUNTER — Encounter (INDEPENDENT_AMBULATORY_CARE_PROVIDER_SITE_OTHER): Payer: Self-pay | Admitting: Vascular Surgery

## 2022-08-25 ENCOUNTER — Ambulatory Visit (INDEPENDENT_AMBULATORY_CARE_PROVIDER_SITE_OTHER): Payer: Self-pay | Admitting: Vascular Surgery

## 2022-08-25 VITALS — BP 110/67 | HR 62 | Resp 16

## 2022-08-25 DIAGNOSIS — I83819 Varicose veins of unspecified lower extremities with pain: Secondary | ICD-10-CM

## 2022-08-27 NOTE — Telephone Encounter (Signed)
ASA 2. Okay to schedule.  

## 2022-08-27 NOTE — Telephone Encounter (Signed)
LMOVM to call back 

## 2022-09-01 ENCOUNTER — Encounter (INDEPENDENT_AMBULATORY_CARE_PROVIDER_SITE_OTHER): Payer: Self-pay | Admitting: Vascular Surgery

## 2022-09-01 NOTE — Telephone Encounter (Signed)
Called pt, LMOVM. Letter mailed.

## 2022-09-03 ENCOUNTER — Other Ambulatory Visit: Payer: Self-pay | Admitting: *Deleted

## 2022-09-03 ENCOUNTER — Encounter: Payer: Self-pay | Admitting: *Deleted

## 2022-09-03 MED ORDER — PEG 3350-KCL-NA BICARB-NACL 420 G PO SOLR: 4000.0000 mL | Freq: Once | ORAL | 0 refills | Status: AC

## 2022-09-12 ENCOUNTER — Ambulatory Visit (INDEPENDENT_AMBULATORY_CARE_PROVIDER_SITE_OTHER): Payer: Self-pay | Admitting: Nurse Practitioner

## 2022-09-24 ENCOUNTER — Telehealth: Payer: Self-pay | Admitting: *Deleted

## 2022-09-24 NOTE — Telephone Encounter (Signed)
Received VM from pt. Calld back, phone rang few times and then stopped. Called back and it rang few times then stopped.

## 2022-09-30 ENCOUNTER — Other Ambulatory Visit: Payer: Self-pay

## 2022-09-30 ENCOUNTER — Encounter (HOSPITAL_COMMUNITY): Payer: Self-pay

## 2022-09-30 ENCOUNTER — Ambulatory Visit (HOSPITAL_BASED_OUTPATIENT_CLINIC_OR_DEPARTMENT_OTHER): Payer: Self-pay | Admitting: Anesthesiology

## 2022-09-30 ENCOUNTER — Encounter (HOSPITAL_COMMUNITY)
Admission: RE | Disposition: A | Discharge status: Home / Self Care | Payer: Self-pay | Source: Home / Self Care | Attending: Internal Medicine

## 2022-09-30 ENCOUNTER — Ambulatory Visit (HOSPITAL_COMMUNITY): Payer: Self-pay | Admitting: Anesthesiology

## 2022-09-30 ENCOUNTER — Ambulatory Visit (HOSPITAL_COMMUNITY)
Admission: RE | Admit: 2022-09-30 | Discharge: 2022-09-30 | Disposition: A | Discharge status: Home / Self Care | Payer: Self-pay | Attending: Internal Medicine | Admitting: Internal Medicine

## 2022-09-30 DIAGNOSIS — Z1211 Encounter for screening for malignant neoplasm of colon: Secondary | ICD-10-CM | POA: Insufficient documentation

## 2022-09-30 DIAGNOSIS — D122 Benign neoplasm of ascending colon: Secondary | ICD-10-CM | POA: Insufficient documentation

## 2022-09-30 DIAGNOSIS — K648 Other hemorrhoids: Secondary | ICD-10-CM

## 2022-09-30 HISTORY — PX: COLONOSCOPY WITH PROPOFOL: SHX5780

## 2022-09-30 HISTORY — PX: POLYPECTOMY: SHX5525

## 2022-09-30 HISTORY — DX: Other specified health status: Z78.9

## 2022-09-30 SURGERY — COLONOSCOPY WITH PROPOFOL
Anesthesia: General

## 2022-09-30 MED ORDER — LIDOCAINE HCL (CARDIAC) PF 100 MG/5ML IV SOSY
PREFILLED_SYRINGE | INTRAVENOUS | Status: DC | PRN
  Administered 2022-09-30: 50 mg via INTRAVENOUS

## 2022-09-30 MED ORDER — PROPOFOL 10 MG/ML IV BOLUS
INTRAVENOUS | Status: DC | PRN
  Administered 2022-09-30: 100 mg via INTRAVENOUS
  Administered 2022-09-30 (×2): 30 mg via INTRAVENOUS
  Administered 2022-09-30: 40 mg via INTRAVENOUS
  Administered 2022-09-30 (×2): 30 mg via INTRAVENOUS

## 2022-09-30 MED ORDER — LACTATED RINGERS IV SOLN: INTRAVENOUS | Status: DC

## 2022-09-30 NOTE — Transfer of Care (Signed)
Immediate Anesthesia Transfer of Care Note  Patient: Margaret Greene  Procedure(s) Performed: COLONOSCOPY WITH PROPOFOL POLYPECTOMY  Patient Location: Endoscopy Unit  Anesthesia Type:General  Level of Consciousness: drowsy  Airway & Oxygen Therapy: Patient Spontanous Breathing  Post-op Assessment: Report given to RN and Post -op Vital signs reviewed and stable  Post vital signs: Reviewed and stable  Last Vitals:  Vitals Value Taken Time  BP    Temp    Pulse    Resp    SpO2      Last Pain:  Vitals:   09/30/22 1323  TempSrc:   PainSc: 0-No pain      Patients Stated Pain Goal: 7 (09/30/22 1205)  Complications: No notable events documented.

## 2022-09-30 NOTE — Op Note (Signed)
[facility] Patient Name: Margaret Greene Procedure Date: 09/30/2022 1:14 PM MRN: [id number] Date of Birth: [birth year] Attending MD: Hennie Duos. Marletta Lor , [place], [id number] CSN: [id number] Age: 52 Admit Type: Outpatient Procedure:                Colonoscopy Indications:              Screening for colorectal malignant neoplasm Providers:                Hennie Duos. Marletta Lor, DO, Angelica Ran, Lennice Sites                            Technician, Technician Referring MD:              Medicines:                See the Anesthesia note for documentation of the                            administered medications Complications:            No immediate complications. Estimated Blood Loss:     Estimated blood loss was minimal. Procedure:                Pre-Anesthesia Assessment:                           - The anesthesia plan was to use monitored                            anesthesia care (MAC).                           After obtaining informed consent, the colonoscope                            was passed under direct vision. Throughout the                            procedure, the patient's blood pressure, pulse, and                            oxygen saturations were monitored continuously. The                            [id number]-HQ190L ([id number]) scope was introduced through                            the anus and advanced to the the cecum, identified                            by appendiceal orifice and ileocecal valve. The                            colonoscopy was performed without difficulty. The                            patient tolerated the procedure well.  The quality                            of the bowel preparation was evaluated using the                            BBPS [facility] Bowel Preparation Scale) with scores                            of: Right Colon = 3, Transverse Colon = 3 and Left                            Colon = 3 (entire mucosa seen well with no residual                             staining, small fragments of stool or opaque                            liquid). The total BBPS score equals 9. Scope In: 1:28:32 PM Scope Out: 1:43:42 PM Scope Withdrawal Time: 0 hours 10 minutes 48 seconds  Total Procedure Duration: 0 hours 15 minutes 10 seconds  Findings:      Non-bleeding internal hemorrhoids were found during endoscopy.      A 5 mm polyp was found in the ascending colon. The polyp was sessile.       The polyp was removed with a cold snare. Resection and retrieval were       complete.      The exam was otherwise without abnormality. Impression:               - Non-bleeding internal hemorrhoids.                           - One 5 mm polyp in the ascending colon, removed                            with a cold snare. Resected and retrieved.                           - The examination was otherwise normal. Moderate Sedation:      Per Anesthesia Care Recommendation:           - Patient has a contact number available for                            emergencies. The signs and symptoms of potential                            delayed complications were discussed with the                            patient. Return to normal activities tomorrow.                            Written discharge instructions were provided to the  patient.                           - Resume previous diet.                           - Continue present medications.                           - Await pathology results.                           - Repeat colonoscopy in 7 years for surveillance.                           - Return to GI clinic PRN. Procedure Code(s):        --- Professional ---                           [phone], Colonoscopy, flexible; with removal of                            tumor(s), polyp(s), or other lesion(s) by snare                            technique Diagnosis Code(s):        --- Professional ---                           Z12.11, Encounter for screening  for malignant                            neoplasm of colon                           D12.2, Benign neoplasm of ascending colon                           K64.8, Other hemorrhoids CPT copyright 2022 American Medical Association. All rights reserved. The codes documented in this report are preliminary and upon coder review may  be revised to meet current compliance requirements. Hennie Duos. Marletta Lor, DO Hennie Duos. Marletta Lor, DO 09/30/2022 1:45:33 PM This report has been signed electronically. Number of Addenda: 0

## 2022-09-30 NOTE — Anesthesia Postprocedure Evaluation (Signed)
Anesthesia Post Note  Patient: Margaret Greene  Procedure(s) Performed: COLONOSCOPY WITH PROPOFOL POLYPECTOMY  Patient location during evaluation: Phase II Anesthesia Type: General Level of consciousness: awake and alert and oriented Pain management: pain level controlled Vital Signs Assessment: post-procedure vital signs reviewed and stable Respiratory status: spontaneous breathing, nonlabored ventilation and respiratory function stable Cardiovascular status: blood pressure returned to baseline and stable Postop Assessment: no apparent nausea or vomiting Anesthetic complications: no  No notable events documented.   Last Vitals:  Vitals:   09/30/22 1205 09/30/22 1347  BP: (!) 114/6 (!) 91/44  Pulse: 65 70  Resp: 15 14  Temp: 36.6 C 36.6 C  SpO2: 98% 97%    Last Pain:  Vitals:   09/30/22 1347  TempSrc: Oral  PainSc: 0-No pain                 Mykenzi Vanzile C Safwan Tomei

## 2022-09-30 NOTE — H&P (Signed)
Primary Care Physician:  Tylene Fantasia., PA-C Primary Gastroenterologist:  Dr. Marletta Lor  Pre-Procedure History & Physical: HPI:  Margaret Greene is a 52 y.o. female is here for first ever colonoscopy for colon cancer screening purposes.  Patient denies any family history of colorectal cancer.  No melena or hematochezia.  No abdominal pain or unintentional weight loss.  No change in bowel habits.  Overall feels well from a GI standpoint.  Past Medical History:  Diagnosis Date   Medical history non-contributory     Past Surgical History:  Procedure Laterality Date   NO PAST SURGERIES      Prior to Admission medications   Medication Sig Start Date End Date Taking? Authorizing Provider  Carboxymethylcellulose Sodium (THERATEARS) 0.25 % SOLN Place 1 drop into both eyes daily.   Yes [provider]  Cyanocobalamin (VITAMIN B-12) 5000 MCG TBDP Take 10,000 mg by mouth daily.   Yes [provider]  magnesium (MAGTAB) 84 MG ( ) TBCR SR tablet Take 84 mg by mouth.   Yes [provider]    Allergies as of 09/03/2022   (No Known Allergies)    Family History  Problem Relation Age of Onset   Colon cancer Neg Hx     Social History   Socioeconomic History   Marital status: Married    Spouse name: Not on file   Number of children: Not on file   Years of education: Not on file   Highest education level: Not on file  Occupational History   Not on file  Tobacco Use   Smoking status: Never    Passive exposure: Never   Smokeless tobacco: Never  Vaping Use   Vaping Use: Never used  Substance and Sexual Activity   Alcohol use: Never   Drug use: Never   Sexual activity: Yes    Birth control/protection: None  Other Topics Concern   Not on file  Social History Narrative   Not on file   Social Determinants of Health   Financial Resource Strain: Not on file  Food Insecurity: No Food Insecurity (04/26/2021)   Hunger Vital Sign    Worried About Running Out  of Food in the Last Year: Never true    Ran Out of Food in the Last Year: Never true  Transportation Needs: No Transportation Needs (04/26/2021)   PRAPARE - Administrator, Civil Service (Medical): No    Lack of Transportation (Non-Medical): No  Physical Activity: Not on file  Stress: Not on file  Social Connections: Not on file  Intimate Partner Violence: Not on file    Review of Systems: See HPI, otherwise negative ROS  Physical Exam: Vital signs in last 24 hours: Temp:  [97.9 F (36.6 C)] 97.9 F (36.6 C) (04/23 1205) Pulse Rate:  [65] 65 (04/23 1205) Resp:  [15] 15 (04/23 1205) BP: (114)/(6) 114/6 (04/23 1205) SpO2:  [98 %] 98 % (04/23 1205) Weight:  [59.9 kg] 59.9 kg (04/23 1205)   General:   Alert,  Well-developed, well-nourished, pleasant and cooperative in NAD Head:  Normocephalic and atraumatic. Eyes:  Sclera clear, no icterus.   Conjunctiva pink. Ears:  Normal auditory acuity. Nose:  No deformity, discharge,  or lesions. Msk:  Symmetrical without gross deformities. Normal posture. Extremities:  Without clubbing or edema. Neurologic:  Alert and  oriented x4;  grossly normal neurologically. Skin:  Intact without significant lesions or rashes. Psych:  Alert and cooperative. Normal mood and affect.  Impression/Plan: Margaret Greene  is here for a colonoscopy to be performed for colon cancer screening purposes.  The risks of the procedure including infection, bleed, or perforation as well as benefits, limitations, alternatives and imponderables have been reviewed with the patient. Questions have been answered. All parties agreeable.

## 2022-09-30 NOTE — Discharge Instructions (Addendum)
Your colonoscopy revealed 1 polyp(s) which I removed successfully. Await pathology results, my office will contact you. I recommend repeating colonoscopy in 7 years for surveillance purposes. Otherwise follow up with GI as needed.   It was very nice meeting you today!  Dr. Marletta Lor

## 2022-09-30 NOTE — Anesthesia Procedure Notes (Signed)
Date/Time: 09/30/2022 1:27 PM  Performed by: Julian Reil, CRNAPre-anesthesia Checklist: Patient identified, Emergency Drugs available, Suction available and Patient being monitored Patient Re-evaluated:Patient Re-evaluated prior to induction Oxygen Delivery Method: Nasal cannula Induction Type: IV induction Placement Confirmation: positive ETCO2

## 2022-09-30 NOTE — Anesthesia Preprocedure Evaluation (Signed)
Anesthesia Evaluation  Patient identified by MRN, date of birth, ID band Patient awake    Reviewed: Allergy & Precautions, H&P , NPO status , Patient's Chart, lab work & pertinent test results  Airway Mallampati: II  TM Distance: >3 FB Neck ROM: Full    Dental  (+) Dental Advisory Given, Teeth Intact   Pulmonary neg pulmonary ROS   Pulmonary exam normal breath sounds clear to auscultation       Cardiovascular negative cardio ROS Normal cardiovascular exam Rhythm:Regular Rate:Normal     Neuro/Psych negative neurological ROS  negative psych ROS   GI/Hepatic negative GI ROS, Neg liver ROS,,,  Endo/Other  negative endocrine ROS    Renal/GU negative Renal ROS  negative genitourinary   Musculoskeletal negative musculoskeletal ROS (+)    Abdominal   Peds negative pediatric ROS (+)  Hematology negative hematology ROS (+)   Anesthesia Other Findings   Reproductive/Obstetrics negative OB ROS                             Anesthesia Physical Anesthesia Plan  ASA: 1  Anesthesia Plan: General   Post-op Pain Management: Minimal or no pain anticipated   Induction:   PONV Risk Score and Plan: 1 and Propofol infusion  Airway Management Planned:   Additional Equipment:   Intra-op Plan:   Post-operative Plan:   Informed Consent: I have reviewed the patients History and Physical, chart, labs and discussed the procedure including the risks, benefits and alternatives for the proposed anesthesia with the patient or authorized representative who has indicated his/her understanding and acceptance.     Dental advisory given  Plan Discussed with: CRNA and Surgeon  Anesthesia Plan Comments:        Anesthesia Quick Evaluation

## 2022-10-01 LAB — SURGICAL PATHOLOGY

## 2022-10-03 ENCOUNTER — Encounter (HOSPITAL_COMMUNITY): Payer: Self-pay | Admitting: Internal Medicine

## 2022-10-13 ENCOUNTER — Encounter (INDEPENDENT_AMBULATORY_CARE_PROVIDER_SITE_OTHER): Payer: Self-pay | Admitting: Nurse Practitioner

## 2022-10-13 ENCOUNTER — Ambulatory Visit (INDEPENDENT_AMBULATORY_CARE_PROVIDER_SITE_OTHER): Payer: Self-pay | Admitting: Nurse Practitioner

## 2022-10-13 VITALS — BP 103/67 | HR 56 | Resp 16 | Ht 64.0 in | Wt 131.4 lb

## 2022-10-13 DIAGNOSIS — I83819 Varicose veins of unspecified lower extremities with pain: Secondary | ICD-10-CM

## 2022-10-13 NOTE — Progress Notes (Signed)
Subjective:    Patient ID: Margaret Greene, female    DOB: 10-05-1970, 52 y.o.   MRN: [id number] Chief Complaint  Patient presents with   Follow-up    4 week post sclero f/u see gs/fb    The patient returns to the office for followup post sclerotherapy bilaterally.  The patient note significant improvement in the lower extremity pain but not resolution of the symptoms. The patient notes multiple residual varicosities bilaterally which continued to hurt with dependent positions and remained tender to palpation. The patient continues to wear graduated compression stockings on a daily basis but these are not eliminating the pain and discomfort. The patient continues to use over-the-counter anti-inflammatory medications to treat the pain and related symptoms but this has not given the patient relief. The patient notes the pain in the lower extremities is causing problems with daily exercise, problems at work and even with household activities such as preparing meals and doing dishes.  Previous, Duplex ultrasound of the venous system bilaterally shows normal deep venous system.  There is trivial reflux noted at the saphenofemoral junction only.  Otherwise no superficial reflux is noted in the saphenous veins.  The small saphenous and great saphenous veins are patent bilaterally.  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        Review of Systems  Hematological:  Bruises/bleeds easily.  All other systems reviewed and are negative.      Objective:   Physical Exam Vitals reviewed.  HENT:     Head: Normocephalic.  Cardiovascular:     Rate and Rhythm: Normal rate.     Pulses: Normal pulses.  Pulmonary:     Effort: Pulmonary effort is normal.  Musculoskeletal:        General: Tenderness present.  Skin:    General: Skin is warm and dry.  Neurological:     Mental Status: She is alert and oriented to person,  place, and time.  Psychiatric:        Mood and Affect: Mood normal.        Behavior: Behavior normal.        Thought Content: Thought content normal.        Judgment: Judgment normal.     BP 103/67 (BP Location: Left Arm)   Pulse (!) 56   Resp 16   Ht 5\' 4"  (1.626 m)   Wt 131 lb 6.4 oz (59.6 kg)   LMP 02/02/2017   BMI 22.55 kg/m   Past Medical History:  Diagnosis Date   Medical history non-contributory     Social History   Socioeconomic History   Marital status: Married    Spouse name: Not on file   Number of children: Not on file   Years of education: Not on file   Highest education level: Not on file  Occupational History   Not on file  Tobacco Use   Smoking status: Never    Passive exposure: Never   Smokeless tobacco: Never  Vaping Use   Vaping Use: Never used  Substance and Sexual Activity   Alcohol use: Never   Drug use: Never   Sexual activity: Yes    Birth control/protection: None  Other Topics Concern   Not on file  Social History Narrative   Not on file   Social Determinants of Health   Financial Resource Strain: Not on file  Food Insecurity: No Food Insecurity (  04/26/2021)   Hunger Vital Sign    Worried About Running Out of Food in the Last Year: Never true    Ran Out of Food in the Last Year: Never true  Transportation Needs: No Transportation Needs (04/26/2021)   PRAPARE - Administrator, Civil Service (Medical): No    Lack of Transportation (Non-Medical): No  Physical Activity: Not on file  Stress: Not on file  Social Connections: Not on file  Intimate Partner Violence: Not on file    Past Surgical History:  Procedure Laterality Date   COLONOSCOPY WITH PROPOFOL N/A 09/30/2022   Procedure: COLONOSCOPY WITH PROPOFOL;  Surgeon: Lanelle Bal, DO;  Location: [facility];  Service: Endoscopy;  Laterality: N/A;  1:15 PM   NO PAST SURGERIES     POLYPECTOMY  09/30/2022   Procedure: POLYPECTOMY;  Surgeon: Lanelle Bal,  DO;  Location: [facility];  Service: Endoscopy;;    Family History  Problem Relation Age of Onset   Colon cancer Neg Hx     No Known Allergies      No data to display            CMP  No results found for: "NA", "K", "CL", "CO2", "GLUCOSE", "BUN", "CREATININE", "CALCIUM", "PROT", "ALBUMIN", "AST", "ALT", "ALKPHOS", "BILITOT", "GFRNONAA", "GFRAA"   No results found.     Assessment & Plan:   1. Varicose veins with pain Recommend:  The patient has had successful ablation of the previously incompetent saphenous venous system but still has persistent symptoms of pain and swelling that are having a negative impact on daily life and daily activities.  Patient should undergo injection sclerotherapy to treat the residual varicosities.  The risks, benefits and alternative therapies were reviewed in detail with the patient.  All questions were answered.  The patient agrees to proceed with sclerotherapy at their convenience.  The patient will continue wearing the graduated compression stockings and using the over-the-counter pain medications to treat her symptoms.       Current Outpatient Medications on File Prior to Visit  Medication Sig Dispense Refill   Carboxymethylcellulose Sodium (THERATEARS) 0.25 % SOLN Place 1 drop into both eyes daily.     Cyanocobalamin (VITAMIN B-12) 5000 MCG TBDP Take 10,000 mg by mouth daily.     magnesium (MAGTAB) 84 MG ( ) TBCR SR tablet Take 84 mg by mouth.     No current facility-administered medications on file prior to visit.    There are no Patient Instructions on file for this visit. No follow-ups on file.   Georgiana Spinner, NP

## 2022-11-10 ENCOUNTER — Telehealth (INDEPENDENT_AMBULATORY_CARE_PROVIDER_SITE_OTHER): Payer: Self-pay

## 2022-11-10 NOTE — Telephone Encounter (Signed)
I used the interpretor line to speak with Margaret Greene about saline sclerotherapy.  Pt states she does not have insurance at this time and will call the office when she is ready to schedule these appointments.

## 2023-07-31 ENCOUNTER — Other Ambulatory Visit (HOSPITAL_COMMUNITY): Payer: Self-pay | Admitting: *Deleted

## 2023-07-31 DIAGNOSIS — Z1231 Encounter for screening mammogram for malignant neoplasm of breast: Secondary | ICD-10-CM

## 2023-08-10 ENCOUNTER — Ambulatory Visit (HOSPITAL_COMMUNITY)
Admission: RE | Admit: 2023-08-10 | Discharge: 2023-08-10 | Disposition: A | Discharge status: Home / Self Care | Payer: Self-pay | Source: Ambulatory Visit | Attending: *Deleted | Admitting: *Deleted

## 2023-08-10 ENCOUNTER — Encounter (HOSPITAL_COMMUNITY): Payer: Self-pay

## 2023-08-10 DIAGNOSIS — Z1231 Encounter for screening mammogram for malignant neoplasm of breast: Secondary | ICD-10-CM | POA: Insufficient documentation

## 2024-04-22 ENCOUNTER — Other Ambulatory Visit (HOSPITAL_COMMUNITY): Payer: Self-pay | Admitting: *Deleted

## 2024-04-22 DIAGNOSIS — N644 Mastodynia: Secondary | ICD-10-CM

## 2024-04-25 ENCOUNTER — Emergency Department (HOSPITAL_COMMUNITY)
Admission: EM | Admit: 2024-04-25 | Discharge: 2024-04-25 | Disposition: A | Discharge status: Home / Self Care | Payer: Self-pay | Attending: Emergency Medicine | Admitting: Emergency Medicine

## 2024-04-25 ENCOUNTER — Other Ambulatory Visit: Payer: Self-pay

## 2024-04-25 ENCOUNTER — Encounter (HOSPITAL_COMMUNITY): Payer: Self-pay

## 2024-04-25 ENCOUNTER — Emergency Department (HOSPITAL_COMMUNITY): Payer: Self-pay

## 2024-04-25 DIAGNOSIS — R519 Headache, unspecified: Secondary | ICD-10-CM | POA: Insufficient documentation

## 2024-04-25 LAB — CBC WITH DIFFERENTIAL/PLATELET
Abs Immature Granulocytes: 0.02 K/uL (ref 0.00–0.07)
Basophils Absolute: 0 K/uL (ref 0.0–0.1)
Basophils Relative: 1 %
Eosinophils Absolute: 0 K/uL (ref 0.0–0.5)
Eosinophils Relative: 1 %
HCT: 39.7 % (ref 36.0–46.0)
Hemoglobin: 13.3 g/dL (ref 12.0–15.0)
Immature Granulocytes: 0 %
Lymphocytes Relative: 18 %
Lymphs Abs: 1 K/uL (ref 0.7–4.0)
MCH: 31.1 pg (ref 26.0–34.0)
MCHC: 33.5 g/dL (ref 30.0–36.0)
MCV: 93 fL (ref 80.0–100.0)
Monocytes Absolute: 0.3 K/uL (ref 0.1–1.0)
Monocytes Relative: 6 %
Neutro Abs: 4.2 K/uL (ref 1.7–7.7)
Neutrophils Relative %: 74 %
Platelets: 168 K/uL (ref 150–400)
RBC: 4.27 MIL/uL (ref 3.87–5.11)
RDW: 12.4 % (ref 11.5–15.5)
WBC: 5.6 K/uL (ref 4.0–10.5)
nRBC: 0 % (ref 0.0–0.2)

## 2024-04-25 LAB — BASIC METABOLIC PANEL WITH GFR
Anion gap: 10 (ref 5–15)
BUN: 16 mg/dL (ref 6–20)
CO2: 27 mmol/L (ref 22–32)
Calcium: 9.2 mg/dL (ref 8.9–10.3)
Chloride: 104 mmol/L (ref 98–111)
Creatinine, Ser: 0.48 mg/dL (ref 0.44–1.00)
GFR, Estimated: >60 mL/min (ref >60)
Glucose, Bld: 104 mg/dL — ABNORMAL HIGH (ref 70–99)
Potassium: 3.7 mmol/L (ref 3.5–5.1)
Sodium: 141 mmol/L (ref 135–145)

## 2024-04-25 MED ORDER — DIPHENHYDRAMINE HCL 50 MG/ML IJ SOLN
12.5000 mg | Freq: Once | INTRAMUSCULAR | Status: AC
  Administered 2024-04-25: 12.5 mg via INTRAVENOUS
  Filled 2024-04-25: qty 1

## 2024-04-25 MED ORDER — DEXAMETHASONE SOD PHOSPHATE PF 10 MG/ML IJ SOLN
10.0000 mg | Freq: Once | INTRAMUSCULAR | Status: AC
  Administered 2024-04-25: 10 mg via INTRAVENOUS

## 2024-04-25 MED ORDER — LORAZEPAM 2 MG/ML IJ SOLN
0.5000 mg | Freq: Once | INTRAMUSCULAR | Status: AC
  Administered 2024-04-25: 0.5 mg via INTRAVENOUS
  Filled 2024-04-25: qty 1

## 2024-04-25 MED ORDER — GADOBUTROL 1 MMOL/ML IV SOLN
7.0000 mL | Freq: Once | INTRAVENOUS | Status: AC | PRN
  Administered 2024-04-25: 7 mL via INTRAVENOUS

## 2024-04-25 MED ORDER — PROCHLORPERAZINE EDISYLATE 10 MG/2ML IJ SOLN
10.0000 mg | Freq: Once | INTRAMUSCULAR | Status: AC
  Administered 2024-04-25: 10 mg via INTRAVENOUS
  Filled 2024-04-25: qty 2

## 2024-04-25 MED ORDER — SUMATRIPTAN SUCCINATE 50 MG PO TABS: 50.0000 mg | ORAL_TABLET | ORAL | 0 refills | Status: AC | PRN

## 2024-04-25 NOTE — ED Triage Notes (Signed)
 Patient Come in POV for complaint of Headache that started this am at 3. Was sent here for MRI being [facility] is unable to do any imaging. Asked if [facility] did Labs patient denied.

## 2024-04-25 NOTE — Discharge Instructions (Signed)
 As discussed, I suspect you may have migraine headaches but I am referring you to a neurologist for further evaluation of your symptoms.  Call for an appointment.  In the interim,  you may try the medicine prescribed the next time you have a headache - this medicine is specific for migraine treatment.

## 2024-04-25 NOTE — ED Provider Notes (Signed)
 [place] EMERGENCY DEPARTMENT AT [facility] Provider Note   CSN: [id number] Arrival date & time: 04/25/24  1028     Patient presents with: Headache   Margaret Greene is a 53 y.o. female with no significant past medical history presenting with headache pain, described as sharp, right frontal headache in association with photophobia and phonophobia which woke her from sleep around 3 am today.  She reports when she first woke she also noticed tingling numbness in her left hand and forearm which resolved after about a minute.  She denies neck pain, stiffness, injury, also no fevers or chills.  She does endorse prior episodes of similar headache, infrequent however. Husband states she had similar episode several years ago and MRI imaging at that time were negative. She took tylenol prior to arrival.  Reports increased personal stressors with immigration hurdles.    The history is provided by the patient and the spouse. The history is limited by a language barrier. A language interpreter was used.       Prior to Admission medications   Medication Sig Start Date End Date Taking? Authorizing Provider  SUMAtriptan (IMITREX) 50 MG tablet Take 1 tablet (50 mg total) by mouth every 2 (two) hours as needed for migraine (Maximum dose 2 tablets in 24 hours). May repeat in 2 hours if headache persists or recurs. 04/25/24  Yes Payton Prinsen, PA-C  Carboxymethylcellulose Sodium (THERATEARS) 0.25 % SOLN Place 1 drop into both eyes daily.    [provider]  Cyanocobalamin (VITAMIN B-12) 5000 MCG TBDP Take 10,000 mg by mouth daily.    [provider]  magnesium (MAGTAB) 84 MG ( ) TBCR SR tablet Take 84 mg by mouth.    [provider]    Allergies: Patient has no known allergies.    Review of Systems  Constitutional:  Negative for fever.  HENT:  Negative for congestion and sore throat.   Eyes: Negative.   Respiratory:  Negative for chest tightness and shortness of  breath.   Cardiovascular:  Negative for chest pain.  Gastrointestinal:  Negative for abdominal pain and nausea.  Genitourinary: Negative.   Musculoskeletal:  Negative for arthralgias, joint swelling and neck pain.  Skin: Negative.  Negative for rash and wound.  Neurological:  Positive for headaches. Negative for dizziness, tremors, seizures, syncope, speech difficulty, weakness, light-headedness and numbness.  Psychiatric/Behavioral: Negative.      Updated Vital Signs BP 102/62 (BP Location: Right Arm)   Pulse 84   Temp 98.6 F (37 C) (Oral)   Resp 14   Ht 5' 6 (1.676 m)   LMP 02/02/2017   SpO2 95%   BMI 21.21 kg/m   Physical Exam Vitals and nursing note reviewed.  Constitutional:      Appearance: She is well-developed.     Comments: Uncomfortable appearing  HENT:     Head: Normocephalic and atraumatic.     Right Ear: Tympanic membrane normal.     Left Ear: Tympanic membrane normal.  Eyes:     Extraocular Movements: Extraocular movements intact.     Pupils: Pupils are equal, round, and reactive to light.  Cardiovascular:     Rate and Rhythm: Normal rate.     Heart sounds: Normal heart sounds.  Pulmonary:     Effort: Pulmonary effort is normal.  Abdominal:     Palpations: Abdomen is soft.     Tenderness: There is no abdominal tenderness.  Musculoskeletal:        General: Normal range  of motion.     Cervical back: Normal range of motion and neck supple.  Lymphadenopathy:     Cervical: No cervical adenopathy.  Skin:    General: Skin is warm and dry.     Findings: No rash.  Neurological:     General: No focal deficit present.     Mental Status: She is alert and oriented to person, place, and time.     GCS: GCS eye subscore is 4. GCS verbal subscore is 5. GCS motor subscore is 6.     Cranial Nerves: No cranial nerve deficit.     Sensory: No sensory deficit.     Coordination: Coordination normal.     Gait: Gait normal.     Deep Tendon Reflexes: Reflexes normal.      Comments: Normal heel-shin, normal rapid alternating movements. Cranial nerves III-XII intact.  No pronator drift. Equal grip strength.   Psychiatric:        Speech: Speech normal.        Behavior: Behavior normal.        Thought Content: Thought content normal.     (all labs ordered are listed, but only abnormal results are displayed) Labs Reviewed  BASIC METABOLIC PANEL WITH GFR - Abnormal; Notable for the following components:      Result Value   Glucose, Bld 104 (*)    All other components within normal limits  CBC WITH DIFFERENTIAL/PLATELET    EKG: None ED ECG REPORT   Date: 04/25/2024  Rate: 58  Rhythm: normal sinus rhythm  QRS Axis: normal  Intervals: normal  ST/T Wave abnormalities: normal  Conduction Disutrbances:none  Narrative Interpretation:   Old EKG Reviewed: none available  I have personally reviewed the EKG tracing and agree with the computerized printout as noted.  Radiology: MR Brain W and Wo Contrast Result Date: 04/25/2024 EXAM: MRI BRAIN WITH AND WITHOUT CONTRAST 04/25/2024 02:33:55 PM TECHNIQUE: Multiplanar multisequence MRI of the head/brain was performed with and without the administration of intravenous contrast. COMPARISON: CT head 02/07/20 CLINICAL HISTORY: Transient ischemic attack (TIA); persistent headache, but transient left forearm numbness, now resolved FINDINGS: BRAIN AND VENTRICLES: No acute infarct. Remote left caudate lacunar infarct. No acute intracranial hemorrhage. No mass effect or midline shift. No hydrocephalus. The sella is unremarkable. Normal flow voids. No mass or abnormal enhancement. ORBITS: No acute abnormality. SINUSES: No acute abnormality. BONES AND SOFT TISSUES: Normal bone marrow signal and enhancement. No acute soft tissue abnormality. IMPRESSION: 1. No acute intracranial abnormality. 2. No mass or abnormal enhancement. Electronically signed by: Gilmore Molt MD 04/25/2024 02:50 PM EST RP Workstation: [id number]      Procedures   Medications Ordered in the ED  prochlorperazine (COMPAZINE) injection 10 mg (10 mg Intravenous Given 04/25/24 1153)  diphenhydrAMINE (BENADRYL) injection 12.5 mg (12.5 mg Intravenous Given 04/25/24 1152)  dexamethasone (DECADRON) injection 10 mg (10 mg Intravenous Given 04/25/24 1153)  LORazepam (ATIVAN) injection 0.5 mg (0.5 mg Intravenous Given 04/25/24 1330)  gadobutrol (GADAVIST) 1 MMOL/ML injection 7 mL (7 mLs Intravenous Contrast Given 04/25/24 1349)                                    Medical Decision Making Patient presenting with headache which woke her at 3 AM, she had transient tingling numbness in her left forearm lasting a minute or less upon waking and has had no return of this symptom although continues to have headache  along with phonophobia and photophobia.  She does not have a formal diagnosis of migraine headaches, but does endorse occasional similar symptoms, last episode occurred months ago.  Differential diagnosis including migraine headache, TIA/CVA, she denies any neck pain or injury, possible the left extremity tingling she woke with was secondary to a cervical source and she has no neurodeficits on exam.  This symptom was fleeting, her headache has been more persistent along with symptoms most suggestive of a migraine type headache.  She was given a migraine cocktail while awaiting MRI imaging and her headache symptoms completely resolved.  She remained neurologically intact during her ED stay.  No return of any weakness numbness or tingling.  She was symptom-free at time of discharge.  Discussed various options going forward, they are desirous of formal evaluation by neurologist which is reasonable, referral was given.  In the interim she can try as needed sumatriptan which was prescribed for as needed use if her headache returns.  Return precautions were also outlined.  Amount and/or Complexity of Data Reviewed Labs: ordered.    Details: CBC and  CMET reviewed, no acute findings. Radiology: ordered.    Details: MRI brain negative for CVA or other intracranial process. ECG/medicine tests: ordered.    Details: EKG reviewed, normal sinus rhythm rate 58.  Risk Prescription drug management.        Final diagnoses:  Acute nonintractable headache, unspecified headache type    ED Discharge Orders          Ordered    SUMAtriptan (IMITREX) 50 MG tablet  Every 2 hours PRN        04/25/24 1532               Leva Baine, PA-C 04/25/24 1612    Melvenia Motto, MD 04/26/24 1553

## 2024-05-10 ENCOUNTER — Telehealth: Payer: Self-pay

## 2024-05-10 NOTE — Telephone Encounter (Signed)
 Completed a hospital f/u nurse case management  call as a result of the pt being seen at the [place] [facility] on 11.17.25.  I successfully spoke with patient by way of  [employer]  Pt states she is doing well with no additional headaches or tingling and numbness since her last ER visit.  She states she has not had to use any over the counter pain relief meds or any prescribed meds since the visit   Pt states she has no current related medical needs as it relates to obtaining medications or being assisted with being connected to any other provider appointments or accessing other community related resources at this time.   She states that her next PCP app is scheduled for 2.23.26 at the [facility] and that she has an mammography appt at [facility] on 12.9.25  Pt was advised that her current enrollment with [facility] has expired as of 11.29.24 and inquired if she was interested in re-enrolling back into the program, if her uninsured status have not changed  PLAN -Pt agreed to get re-connected with the [facility]  to assist with keeping her medical services connected while uninsured.  An appointment was scheduled on her behalf with the [facility] program for Thursday (May 12, 2024 a 3:00 pm).  All documents were reviewed with pt to bring to the [facility] appointment  -Pt advised to continue to stay connected with her PCP at the [facility] as she is currently doing to assist with staying connected with her medical care

## 2024-05-17 ENCOUNTER — Ambulatory Visit (HOSPITAL_COMMUNITY): Admission: RE | Admit: 2024-05-17 | Discharge: 2024-05-17 | Payer: Self-pay | Attending: *Deleted | Admitting: *Deleted

## 2024-05-17 ENCOUNTER — Other Ambulatory Visit (HOSPITAL_COMMUNITY): Payer: Self-pay | Admitting: *Deleted

## 2024-05-17 ENCOUNTER — Encounter (HOSPITAL_COMMUNITY): Payer: Self-pay

## 2024-05-17 ENCOUNTER — Ambulatory Visit (HOSPITAL_COMMUNITY)
Admission: RE | Admit: 2024-05-17 | Discharge: 2024-05-17 | Disposition: A | Payer: Self-pay | Source: Ambulatory Visit | Attending: *Deleted | Admitting: *Deleted

## 2024-05-17 DIAGNOSIS — N644 Mastodynia: Secondary | ICD-10-CM

## 2024-07-13 NOTE — Congregational Nurse Program (Signed)
 Pt was referred to me by Care Guide, Terre MATSU of [facility] due to needing advice on how to to establish care with a new PCP.   Pt was previously being seen at the PCP at [facility].  She states would like to receive some additional follow up on her cholesterol values, to assist her with knowing if she has made any improvements by way of her diet and lifestyle changes, since her last visit with the PCP in 04/2024.  Pt will return back to [facility] to complete  [employer] application with Maricarmen G. Guarduno (Care Guide) on Friday, Jul 15, 2024.  Pt is current with [facility] [facility] with an eligibility period til 12.4.26.   ,  Pt was  Scheduled pt a new PCP appointment with [facility] for Monday, Jul 18, 2024 at 8:15am

## 2024-07-18 ENCOUNTER — Ambulatory Visit: Payer: Self-pay | Admitting: Physician Assistant
